# Patient Record
Sex: Male | Born: 1990 | Race: White | Hispanic: No | Smoking: Current every day smoker
Health system: Southern US, Community
[De-identification: ages and names within clinical notes are randomized; demographics above are authoritative.]

---

## 2011-10-20 ENCOUNTER — Emergency Department: Payer: Self-pay | Admitting: *Deleted

## 2014-05-23 ENCOUNTER — Emergency Department: Payer: Self-pay | Admitting: Emergency Medicine

## 2019-03-09 ENCOUNTER — Other Ambulatory Visit: Payer: Self-pay | Admitting: Internal Medicine

## 2019-03-09 ENCOUNTER — Other Ambulatory Visit: Payer: Self-pay

## 2019-03-09 DIAGNOSIS — Z20822 Contact with and (suspected) exposure to covid-19: Secondary | ICD-10-CM

## 2019-03-12 LAB — NOVEL CORONAVIRUS, NAA: SARS-CoV-2, NAA: NOT DETECTED

## 2020-08-07 ENCOUNTER — Encounter: Payer: Self-pay | Admitting: Emergency Medicine

## 2020-08-07 ENCOUNTER — Emergency Department
Admission: EM | Admit: 2020-08-07 | Discharge: 2020-08-07 | Disposition: A | Discharge status: Home / Self Care | Payer: Medicaid Other | Attending: Emergency Medicine | Admitting: Emergency Medicine

## 2020-08-07 ENCOUNTER — Other Ambulatory Visit: Payer: Self-pay

## 2020-08-07 ENCOUNTER — Emergency Department: Payer: Medicaid Other

## 2020-08-07 DIAGNOSIS — B349 Viral infection, unspecified: Secondary | ICD-10-CM | POA: Insufficient documentation

## 2020-08-07 DIAGNOSIS — Z20822 Contact with and (suspected) exposure to covid-19: Secondary | ICD-10-CM | POA: Insufficient documentation

## 2020-08-07 DIAGNOSIS — F17210 Nicotine dependence, cigarettes, uncomplicated: Secondary | ICD-10-CM | POA: Insufficient documentation

## 2020-08-07 LAB — RESP PANEL BY RT-PCR (FLU A&B, COVID) ARPGX2
Influenza A by PCR: NEGATIVE
Influenza B by PCR: NEGATIVE
SARS Coronavirus 2 by RT PCR: NEGATIVE

## 2020-08-07 LAB — POC SARS CORONAVIRUS 2 AG -  ED: SARS Coronavirus 2 Ag: NEGATIVE

## 2020-08-07 MED ORDER — ONDANSETRON 4 MG PO TBDP: 4.0000 mg | ORAL_TABLET | Freq: Three times a day (TID) | ORAL | 0 refills | Status: AC | PRN

## 2020-08-07 MED ORDER — PSEUDOEPH-BROMPHEN-DM 30-2-10 MG/5ML PO SYRP: 10.0000 mL | ORAL_SOLUTION | Freq: Four times a day (QID) | ORAL | 0 refills | Status: AC | PRN

## 2020-08-07 MED ORDER — PREDNISONE 20 MG PO TABS
60.0000 mg | ORAL_TABLET | Freq: Once | ORAL | Status: AC
  Administered 2020-08-07: 60 mg via ORAL
  Filled 2020-08-07: qty 3

## 2020-08-07 MED ORDER — PREDNISONE 50 MG PO TABS: 50.0000 mg | ORAL_TABLET | Freq: Every day | ORAL | 0 refills | Status: AC

## 2020-08-07 NOTE — ED Triage Notes (Signed)
Pt comes into the ED via {POV c/o cough, fever, chills, and congestion.  Pt states he started feeling poorly 4 days ago.  Pt completed at home self rapid COVID test that was negative. Pt currently has even and unlabored respirations and is in NAD.

## 2020-08-07 NOTE — ED Provider Notes (Signed)
[facility] Emergency Department Provider Note  ____________________________________________  Time seen: Approximately 6:25 PM  I have reviewed the triage vital signs and the nursing notes.   HISTORY  Chief Complaint Cough, Nasal Congestion, and Fever    HPI Curtis French is a 29 y.o. male who presents the emergency department complaining of cough, body aches, fevers, chills nasal congestion nausea.  Patient states that he has had symptoms for 4 to 5 days.  Patient took negative at home Covid test.  Has been using DayQuil for symptom relief which helps with fevers but not any other symptoms.  No specific known sick contacts.  Patient denies any difficulty breathing.  No chest pain.  No abdominal pain.  No chronic medical issues.         History reviewed. No pertinent past medical history.  There are no problems to display for this patient.   History reviewed. No pertinent surgical history.  Prior to Admission medications   Medication Sig Start Date End Date Taking? Authorizing Provider  brompheniramine-pseudoephedrine-DM 30-2-10 MG/5ML syrup Take 10 mLs by mouth 4 (four) times daily as needed. 08/07/20  Yes Laurita Peron, Delorise Royals, PA-C  ondansetron (ZOFRAN-ODT) 4 MG disintegrating tablet Take 1 tablet (4 mg total) by mouth every 8 (eight) hours as needed for nausea or vomiting. 08/07/20  Yes Emmalou Hunger, Delorise Royals, PA-C  predniSONE (DELTASONE) 50 MG tablet Take 1 tablet (50 mg total) by mouth daily with breakfast. 08/07/20  Yes Courtez Twaddle, Delorise Royals, PA-C    Allergies Patient has no known allergies.  History reviewed. No pertinent family history.  Social History Social History   Tobacco Use  . Smoking status: Current Every Day Smoker    Packs/day: 0.50    Types: Cigarettes  . Smokeless tobacco: Never Used  Substance Use Topics  . Alcohol use: Not Currently  . Drug use: Yes    Types: Marijuana     Review of Systems  Constitutional:  Positive fever/chills.  Positive for body aches Eyes: No visual changes. No discharge ENT: Positive for nasal congestion and sore throat Cardiovascular: no chest pain. Respiratory: Positive cough. No SOB. Gastrointestinal: No abdominal pain.  No nausea, no vomiting.  No diarrhea.  No constipation. Genitourinary: Negative for dysuria. No hematuria Musculoskeletal: Negative for musculoskeletal pain. Skin: Negative for rash, abrasions, lacerations, ecchymosis. Neurological: Negative for headaches, focal weakness or numbness.  10 System ROS otherwise negative.  ____________________________________________   PHYSICAL EXAM:  VITAL SIGNS: ED Triage Vitals  Enc Vitals Group     BP 08/07/20 1534 (!) 155/112     Pulse Rate 08/07/20 1534 99     Resp 08/07/20 1534 18     Temp 08/07/20 1534 98.7 F (37.1 C)     Temp Source 08/07/20 1534 Oral     SpO2 08/07/20 1534 95 %     Weight 08/07/20 1535 180 lb (81.6 kg)     Height 08/07/20 1535 5\' 8"  (1.727 m)     Head Circumference --      Peak Flow --      Pain Score 08/07/20 1534 4     Pain Loc --      Pain Edu? --      Excl. in GC? --      Constitutional: Alert and oriented. Well appearing and in no acute distress. Eyes: Conjunctivae are normal. PERRL. EOMI. Head: Atraumatic. ENT:      Ears:       Nose: Moderate congestion/rhinnorhea.      Mouth/Throat:  Mucous membranes are moist.  Oropharynx is nonerythematous and nonedematous.  Uvula is midline Neck: No stridor.  Neck is supple full range of motion Hematological/Lymphatic/Immunilogical: No cervical lymphadenopathy. Cardiovascular: Normal rate, regular rhythm. Normal S1 and S2.  Good peripheral circulation. Respiratory: Normal respiratory effort without tachypnea or retractions. Lungs CTAB. Good air entry to the bases with no decreased or absent breath sounds. Musculoskeletal: Full range of motion to all extremities. No gross deformities appreciated. Neurologic:  Normal speech and  language. No gross focal neurologic deficits are appreciated.  Skin:  Skin is warm, dry and intact. No rash noted. Psychiatric: Mood and affect are normal. Speech and behavior are normal. Patient exhibits appropriate insight and judgement.   ____________________________________________   LABS (all labs ordered are listed, but only abnormal results are displayed)  Labs Reviewed  RESP PANEL BY RT-PCR (FLU A&B, COVID) [id number]  POC SARS CORONAVIRUS 2 AG -  ED   ____________________________________________  EKG   ____________________________________________  RADIOLOGY I personally viewed and evaluated these images as part of my medical decision making, as well as reviewing the written report by the radiologist.  ED Provider Interpretation: No evidence of consolidation concerning for pneumonia.  DG Chest 2 View  Result Date: 08/07/2020 CLINICAL DATA:  Cough. EXAM: CHEST - 2 VIEW COMPARISON:  None. FINDINGS: The heart size and mediastinal contours are within normal limits. Both lungs are clear. The visualized skeletal structures are unremarkable. IMPRESSION: No active cardiopulmonary disease. Electronically Signed   By: Lupita Raider M.D.   On: 08/07/2020 17:25    ____________________________________________    PROCEDURES  Procedure(s) performed:    Procedures    Medications  predniSONE (DELTASONE) tablet 60 mg (has no administration in time range)     ____________________________________________   INITIAL IMPRESSION / ASSESSMENT AND PLAN / ED COURSE  Pertinent labs & imaging results that were available during my care of the patient were reviewed by me and considered in my medical decision making (see chart for details).  Review of the [place] CSRS was performed in accordance of the [facility] prior to dispensing any controlled drugs.           Patient's diagnosis is consistent with viral illness.  Patient presented to the emergency department with 4 to 5 days of  symptoms consistent with viral URI.  Differential included Covid, influenza, bronchitis, pneumonia, viral URI.  Negative Covid and influenza testing in the emergency department.  Chest x-ray is reassuring with no evidence of consolidation concerning for pneumonia.  At this time patient will be treated symptomatically with prednisone, Bromfed cough syrup and Zofran.  Continue antipyretics such as Tylenol and Motrin for additional symptom control at home.  Plenty of fluids and rest.  Follow-up primary care as needed..  Patient is given ED precautions to return to the ED for any worsening or new symptoms.     ____________________________________________  FINAL CLINICAL IMPRESSION(S) / ED DIAGNOSES  Final diagnoses:  Viral illness      NEW MEDICATIONS STARTED DURING THIS VISIT:  ED Discharge Orders         Ordered    predniSONE (DELTASONE) 50 MG tablet  Daily with breakfast        08/07/20 1855    brompheniramine-pseudoephedrine-DM 30-2-10 MG/5ML syrup  4 times daily PRN        08/07/20 1855    ondansetron (ZOFRAN-ODT) 4 MG disintegrating tablet  Every 8 hours PRN        08/07/20 1855  This chart was dictated using voice recognition software/Dragon. Despite best efforts to proofread, errors can occur which can change the meaning. Any change was purely unintentional.    Racheal Patches, PA-C 08/07/20 Herbert Seta, MD 08/07/20 2316

## 2022-06-19 IMAGING — CR DG CHEST 2V
2 series · 2 of 2 positions shown · non-contrast
Comparison: None.

CLINICAL DATA: Cough.

EXAM:
CHEST - 2 VIEW

[chest pa]
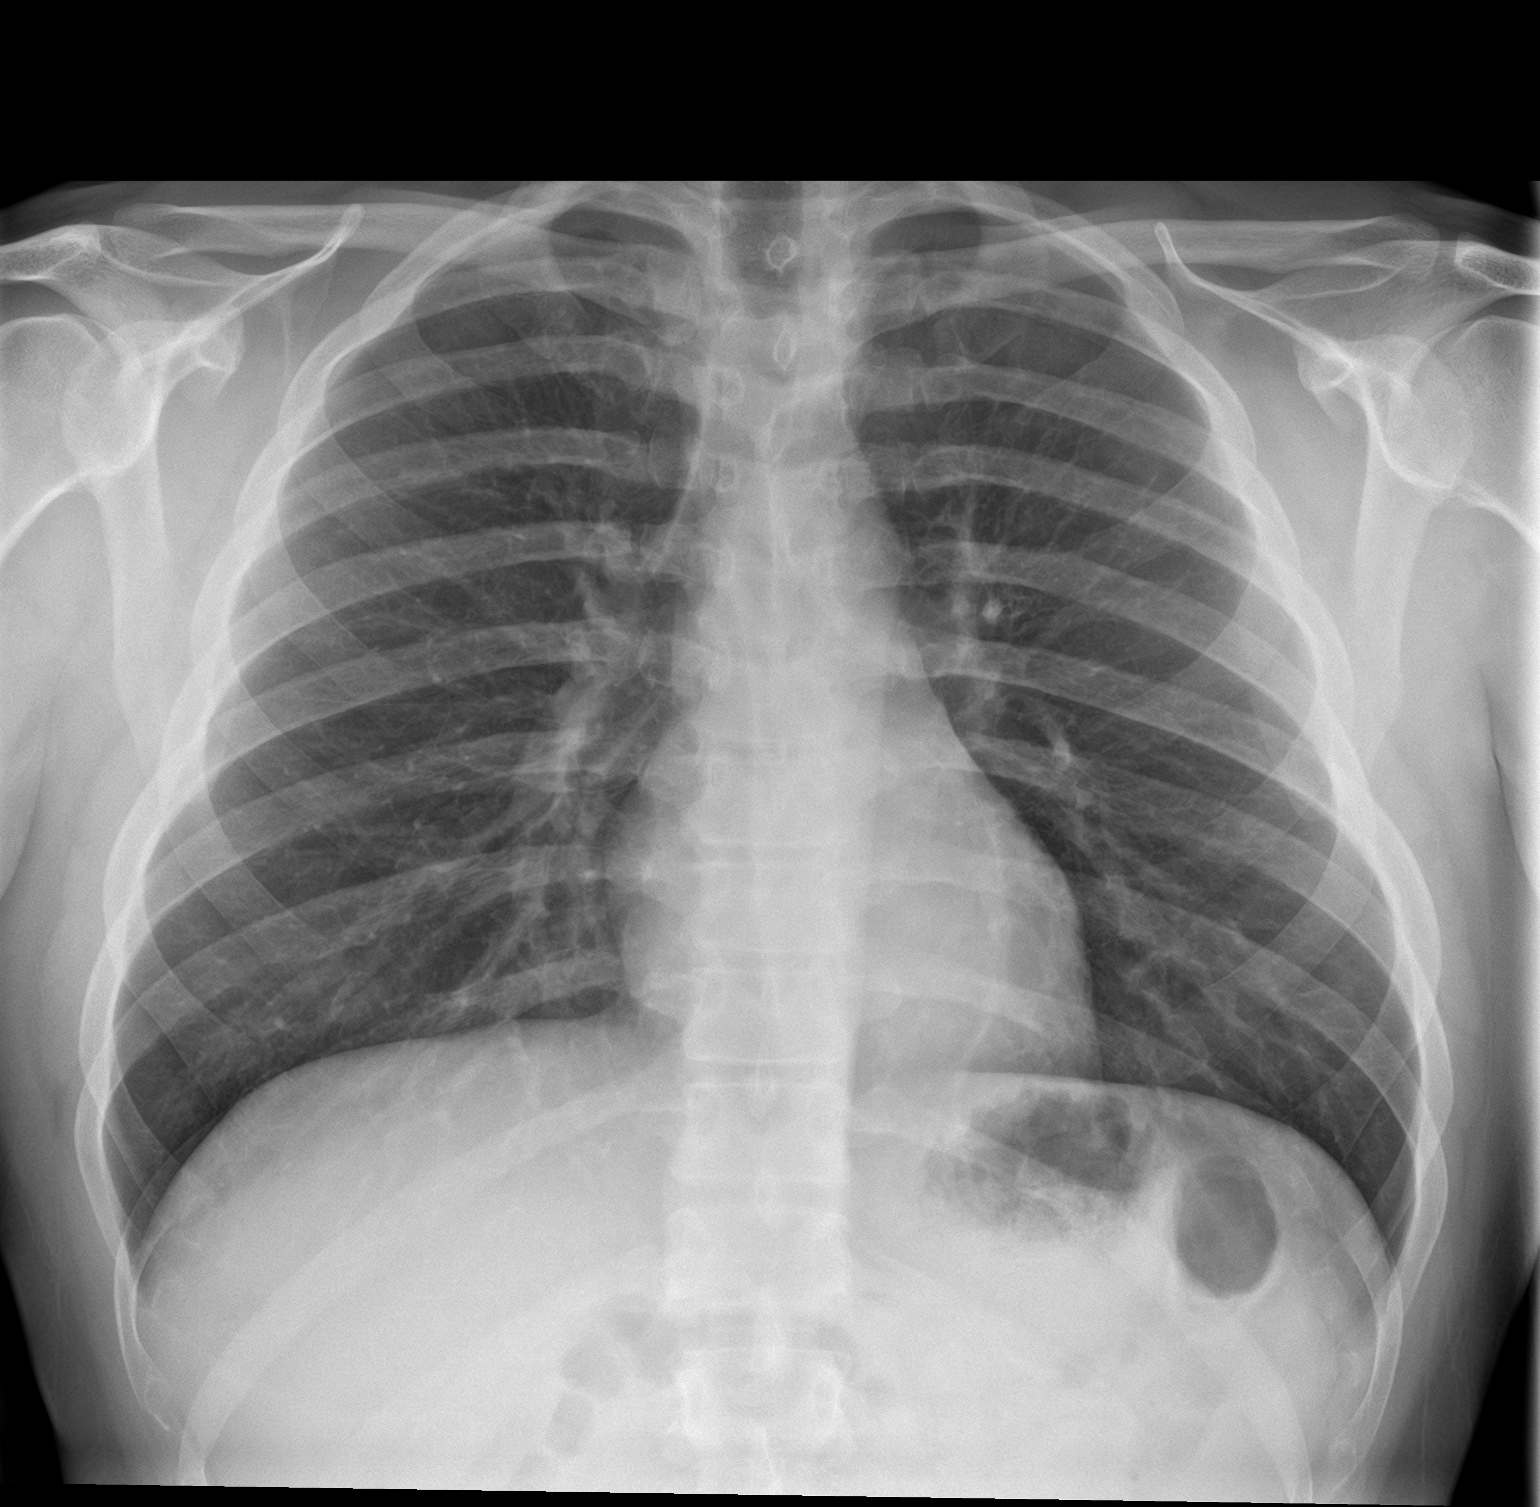

[chest lat]
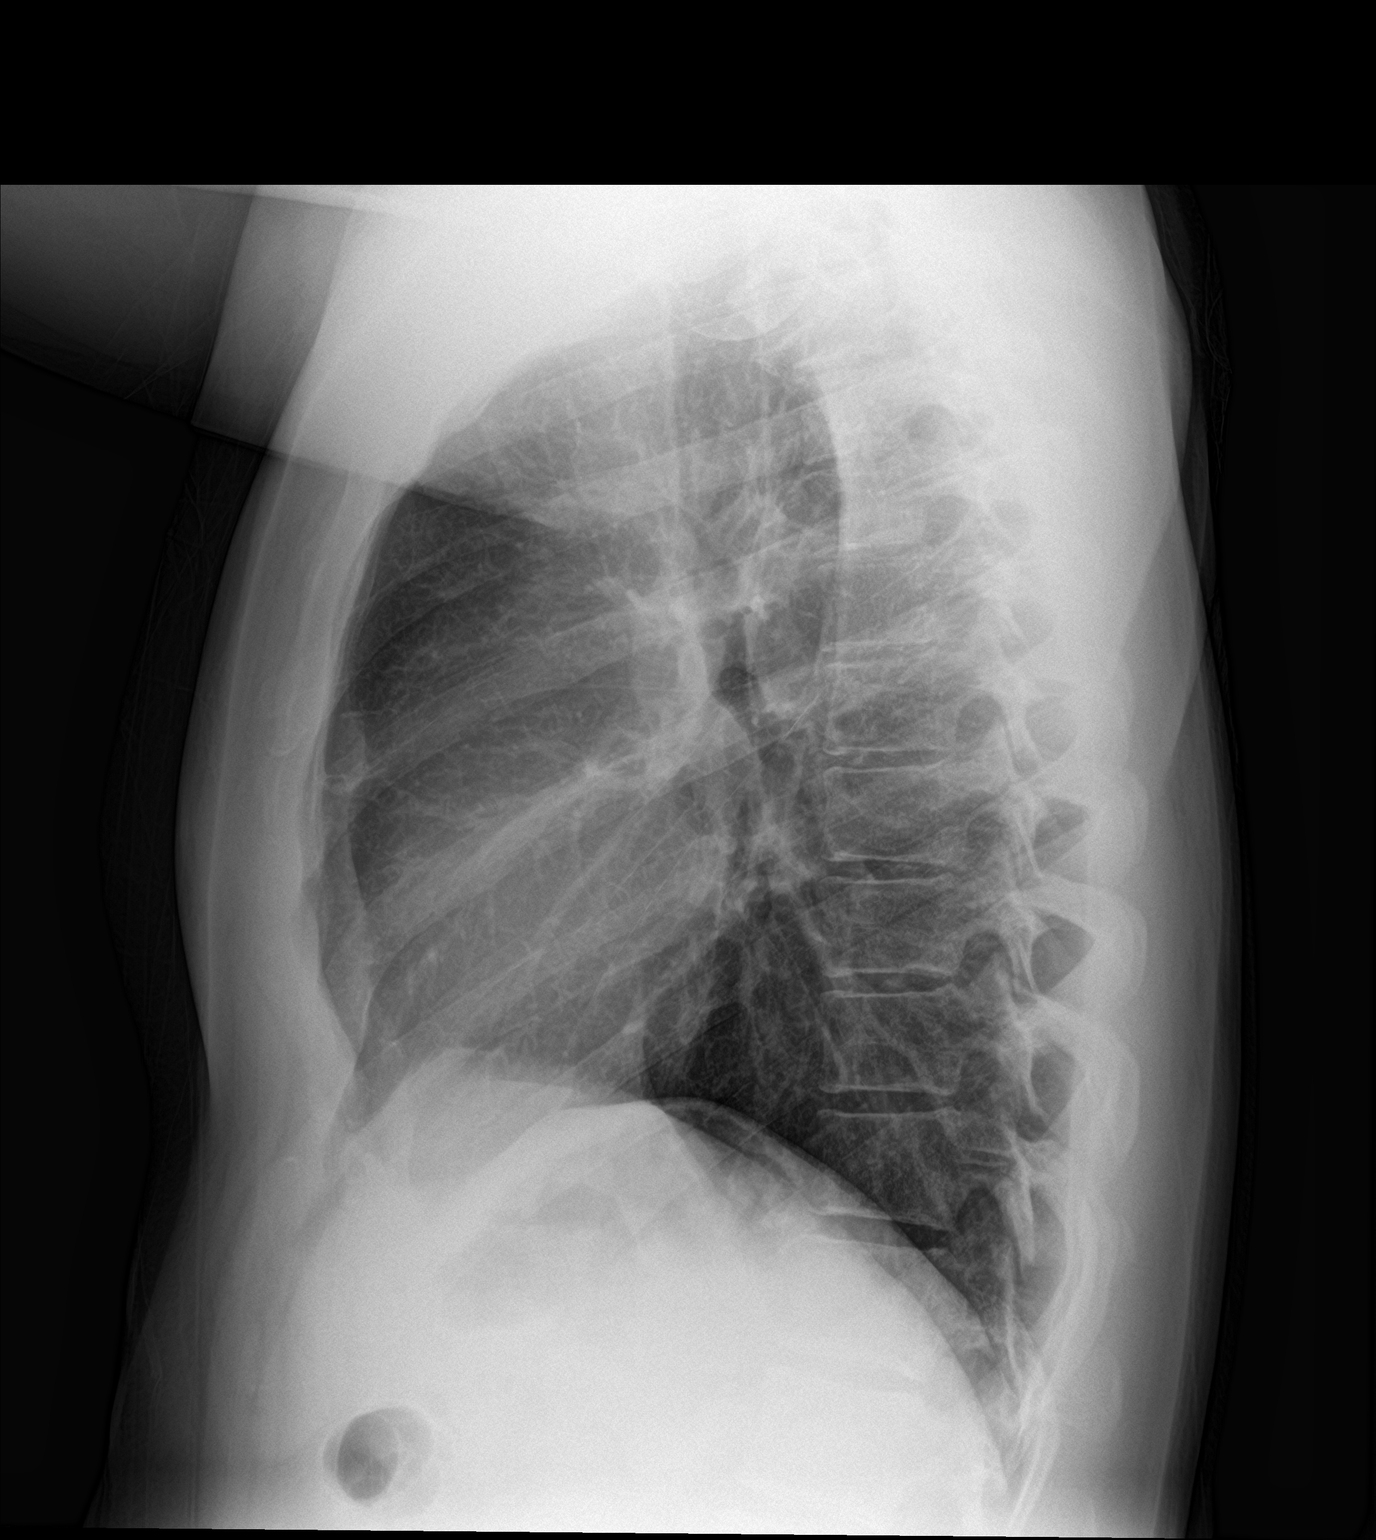

[2 of 2 positions shown; findings below may reference images not displayed]

FINDINGS: The heart size and mediastinal contours are within normal limits.
Both lungs are clear. The visualized skeletal structures are
unremarkable.
IMPRESSION: No active cardiopulmonary disease.
